# Patient Record
Sex: Female | Born: 1955 | Race: Black or African American | Hispanic: No | Marital: Single | Smoking: Never smoker
Health system: Southern US, Community
[De-identification: ages and names within clinical notes are randomized; demographics above are authoritative.]

## PROBLEM LIST (undated history)

## (undated) DIAGNOSIS — I10 Essential (primary) hypertension: Secondary | ICD-10-CM

## (undated) DIAGNOSIS — E079 Disorder of thyroid, unspecified: Secondary | ICD-10-CM

## (undated) DIAGNOSIS — E119 Type 2 diabetes mellitus without complications: Secondary | ICD-10-CM

## (undated) DIAGNOSIS — E7800 Pure hypercholesterolemia, unspecified: Secondary | ICD-10-CM

---

## 2013-04-28 ENCOUNTER — Encounter (HOSPITAL_BASED_OUTPATIENT_CLINIC_OR_DEPARTMENT_OTHER): Payer: Self-pay | Admitting: *Deleted

## 2013-04-28 ENCOUNTER — Emergency Department (HOSPITAL_BASED_OUTPATIENT_CLINIC_OR_DEPARTMENT_OTHER)
Admission: EM | Admit: 2013-04-28 | Discharge: 2013-04-28 | Disposition: A | Discharge status: Home / Self Care | Payer: Self-pay | Attending: Emergency Medicine | Admitting: Emergency Medicine

## 2013-04-28 DIAGNOSIS — J069 Acute upper respiratory infection, unspecified: Secondary | ICD-10-CM | POA: Insufficient documentation

## 2013-04-28 DIAGNOSIS — I10 Essential (primary) hypertension: Secondary | ICD-10-CM | POA: Insufficient documentation

## 2013-04-28 DIAGNOSIS — E079 Disorder of thyroid, unspecified: Secondary | ICD-10-CM | POA: Insufficient documentation

## 2013-04-28 DIAGNOSIS — J029 Acute pharyngitis, unspecified: Secondary | ICD-10-CM | POA: Insufficient documentation

## 2013-04-28 DIAGNOSIS — E7800 Pure hypercholesterolemia, unspecified: Secondary | ICD-10-CM | POA: Insufficient documentation

## 2013-04-28 DIAGNOSIS — Z79899 Other long term (current) drug therapy: Secondary | ICD-10-CM | POA: Insufficient documentation

## 2013-04-28 DIAGNOSIS — E119 Type 2 diabetes mellitus without complications: Secondary | ICD-10-CM | POA: Insufficient documentation

## 2013-04-28 HISTORY — DX: Disorder of thyroid, unspecified: E07.9

## 2013-04-28 HISTORY — DX: Pure hypercholesterolemia, unspecified: E78.00

## 2013-04-28 HISTORY — DX: Essential (primary) hypertension: I10

## 2013-04-28 HISTORY — DX: Type 2 diabetes mellitus without complications: E11.9

## 2013-04-28 NOTE — ED Notes (Signed)
Kelli Fiscal, rn enters room to discharge pt, pt and family not in room. Secretary states she saw pt leave.

## 2013-04-28 NOTE — ED Notes (Signed)
Pt amb to room 1 with quick steady gait, talking on cell phone in nad. Pt reports left ear pain and sore throat x Sunday, denies any fevers.

## 2013-04-28 NOTE — ED Provider Notes (Signed)
History    CSN: [id number] Arrival date & time 04/28/13  1227  First MD Initiated Contact with Patient 04/28/13 1244     Chief Complaint  Patient presents with  . Otalgia  . Sore Throat   (Consider location/radiation/quality/duration/timing/severity/associated sxs/prior Treatment) Patient is a 57 y.o. female presenting with ear pain and pharyngitis.  Otalgia Sore Throat   Pt reports several days of URI symptoms, dry cough, sore throat, L ear pain, not associated with fever. No vomiting. Not improved with OTC delsym and cough drops. Other family members have been sick as well.   Past Medical History  Diagnosis Date  . Hypertension   . Diabetes mellitus without complication   . Thyroid disease   . Hypercholesteremia    History reviewed. No pertinent past surgical history. History reviewed. No pertinent family history. History  Substance Use Topics  . Smoking status: Never Smoker   . Smokeless tobacco: Not on file  . Alcohol Use: Not on file   OB History   Grav Para Term Preterm Abortions TAB SAB Ect Mult Living                 Review of Systems  HENT: Positive for ear pain.    All other systems reviewed and are negative except as noted in HPI.   Allergies  Review of patient's allergies indicates no known allergies.  Home Medications   Current Outpatient Rx  Name  Route  Sig  Dispense  Refill  . atorvastatin (LIPITOR) 20 MG tablet   Oral   Take 20 mg by mouth daily.         . carvedilol (COREG) 12.5 MG tablet   Oral   Take 12.5 mg by mouth 2 (two) times daily with a meal.         . enalapril (VASOTEC) 10 MG tablet   Oral   Take 10 mg by mouth daily.         . furosemide (LASIX) 20 MG tablet   Oral   Take 20 mg by mouth daily.         Marland Kitchen levothyroxine (SYNTHROID, LEVOTHROID) 112 MCG tablet   Oral   Take 112 mcg by mouth daily before breakfast.         . metFORMIN (GLUCOPHAGE) 500 MG tablet   Oral   Take 500 mg by mouth 2 (two) times  daily with a meal.          BP 147/79  Pulse 66  Temp(Src) 98 F (36.7 C) (Oral)  Resp 18  Ht 5\' 4"  (1.626 m)  Wt 175 lb (79.379 kg)  BMI 30.02 kg/m2  SpO2 99% Physical Exam  Nursing note and vitals reviewed. Constitutional: She is oriented to person, place, and time. She appears well-developed and well-nourished.  HENT:  Head: Normocephalic and atraumatic.  Right Ear: Tympanic membrane normal.  Left Ear: Tympanic membrane normal.  Eyes: EOM are normal. Pupils are equal, round, and reactive to light.  Neck: Normal range of motion. Neck supple.  Cardiovascular: Normal rate, normal heart sounds and intact distal pulses.   Pulmonary/Chest: Effort normal and breath sounds normal.  Abdominal: Bowel sounds are normal. She exhibits no distension. There is no tenderness.  Musculoskeletal: Normal range of motion. She exhibits no edema and no tenderness.  Lymphadenopathy:    She has no cervical adenopathy.  Neurological: She is alert and oriented to person, place, and time. She has normal strength. No cranial nerve deficit or sensory deficit.  Skin: Skin is warm and dry. No rash noted.  Psychiatric: She has a normal mood and affect.    ED Course  Procedures (including critical care time) Labs Reviewed  RAPID STREP SCREEN  CULTURE, GROUP A STREP   No results found. 1. Viral URI     MDM  Normal exam, neg strep. Likely a mild viral URI. Advised symptomatic treatment, rest and plenty of fluids. Advised antibiotics would not be effective.   Charles B. Bernette Mayers, MD 04/28/13 1326

## 2013-04-30 LAB — CULTURE, GROUP A STREP

## 2014-10-28 ENCOUNTER — Emergency Department (HOSPITAL_BASED_OUTPATIENT_CLINIC_OR_DEPARTMENT_OTHER)
Admission: EM | Admit: 2014-10-28 | Discharge: 2014-10-28 | Disposition: A | Discharge status: Home / Self Care | Payer: 59 | Attending: Emergency Medicine | Admitting: Emergency Medicine

## 2014-10-28 ENCOUNTER — Encounter (HOSPITAL_BASED_OUTPATIENT_CLINIC_OR_DEPARTMENT_OTHER): Payer: Self-pay | Admitting: *Deleted

## 2014-10-28 ENCOUNTER — Emergency Department (HOSPITAL_BASED_OUTPATIENT_CLINIC_OR_DEPARTMENT_OTHER): Payer: 59

## 2014-10-28 DIAGNOSIS — I10 Essential (primary) hypertension: Secondary | ICD-10-CM | POA: Insufficient documentation

## 2014-10-28 DIAGNOSIS — M179 Osteoarthritis of knee, unspecified: Secondary | ICD-10-CM | POA: Insufficient documentation

## 2014-10-28 DIAGNOSIS — Z79899 Other long term (current) drug therapy: Secondary | ICD-10-CM | POA: Diagnosis not present

## 2014-10-28 DIAGNOSIS — R52 Pain, unspecified: Secondary | ICD-10-CM

## 2014-10-28 DIAGNOSIS — E780 Pure hypercholesterolemia: Secondary | ICD-10-CM | POA: Insufficient documentation

## 2014-10-28 DIAGNOSIS — E119 Type 2 diabetes mellitus without complications: Secondary | ICD-10-CM | POA: Insufficient documentation

## 2014-10-28 DIAGNOSIS — E079 Disorder of thyroid, unspecified: Secondary | ICD-10-CM | POA: Diagnosis not present

## 2014-10-28 DIAGNOSIS — M79604 Pain in right leg: Secondary | ICD-10-CM | POA: Diagnosis present

## 2014-10-28 DIAGNOSIS — M1711 Unilateral primary osteoarthritis, right knee: Secondary | ICD-10-CM

## 2014-10-28 MED ORDER — IBUPROFEN 800 MG PO TABS: 800.0000 mg | ORAL_TABLET | Freq: Three times a day (TID) | ORAL | Status: AC

## 2014-10-28 NOTE — Discharge Instructions (Signed)
Arthritis, Nonspecific °Arthritis is inflammation of a joint. This usually means pain, redness, warmth or swelling are present. One or more joints may be involved. There are a number of types of arthritis. Your caregiver may not be able to tell what type of arthritis you have right away. °CAUSES  °The most common cause of arthritis is the wear and tear on the joint (osteoarthritis). This causes damage to the cartilage, which can break down over time. The knees, hips, back and neck are most often affected by this type of arthritis. °Other types of arthritis and common causes of joint pain include: °· Sprains and other injuries near the joint. Sometimes minor sprains and injuries cause pain and swelling that develop hours later. °· Rheumatoid arthritis. This affects hands, feet and knees. It usually affects both sides of your body at the same time. It is often associated with chronic ailments, fever, weight loss and general weakness. °· Crystal arthritis. Gout and pseudo gout can cause occasional acute severe pain, redness and swelling in the foot, ankle, or knee. °· Infectious arthritis. Bacteria can get into a joint through a break in overlying skin. This can cause infection of the joint. Bacteria and viruses can also spread through the blood and affect your joints. °· Drug, infectious and allergy reactions. Sometimes joints can become mildly painful and slightly swollen with these types of illnesses. °SYMPTOMS  °· Pain is the main symptom. °· Your joint or joints can also be red, swollen and warm or hot to the touch. °· You may have a fever with certain types of arthritis, or even feel overall ill. °· The joint with arthritis will hurt with movement. Stiffness is present with some types of arthritis. °DIAGNOSIS  °Your caregiver will suspect arthritis based on your description of your symptoms and on your exam. Testing may be needed to find the type of arthritis: °· Blood and sometimes urine tests. °· X-ray tests  and sometimes CT or MRI scans. °· Removal of fluid from the joint (arthrocentesis) is done to check for bacteria, crystals or other causes. Your caregiver (or a specialist) will numb the area over the joint with a local anesthetic, and use a needle to remove joint fluid for examination. This procedure is only minimally uncomfortable. °· Even with these tests, your caregiver may not be able to tell what kind of arthritis you have. Consultation with a specialist (rheumatologist) may be helpful. °TREATMENT  °Your caregiver will discuss with you treatment specific to your type of arthritis. If the specific type cannot be determined, then the following general recommendations may apply. °Treatment of severe joint pain includes: °· Rest. °· Elevation. °· Anti-inflammatory medication (for example, ibuprofen) may be prescribed. Avoiding activities that cause increased pain. °· Only take over-the-counter or prescription medicines for pain and discomfort as recommended by your caregiver. °· Cold packs over an inflamed joint may be used for 10 to 15 minutes every hour. Hot packs sometimes feel better, but do not use overnight. Do not use hot packs if you are diabetic without your caregiver's permission. °· A cortisone shot into arthritic joints may help reduce pain and swelling. °· Any acute arthritis that gets worse over the next 1 to 2 days needs to be looked at to be sure there is no joint infection. °Long-term arthritis treatment involves modifying activities and lifestyle to reduce joint stress jarring. This can include weight loss. Also, exercise is needed to nourish the joint cartilage and remove waste. This helps keep the muscles   around the joint strong. °HOME CARE INSTRUCTIONS  °· Do not take aspirin to relieve pain if gout is suspected. This elevates uric acid levels. °· Only take over-the-counter or prescription medicines for pain, discomfort or fever as directed by your caregiver. °· Rest the joint as much as  possible. °· If your joint is swollen, keep it elevated. °· Use crutches if the painful joint is in your leg. °· Drinking plenty of fluids may help for certain types of arthritis. °· Follow your caregiver's dietary instructions. °· Try low-impact exercise such as: °¨ Swimming. °¨ Water aerobics. °¨ Biking. °¨ Walking. °· Morning stiffness is often relieved by a warm shower. °· Put your joints through regular range-of-motion. °SEEK MEDICAL CARE IF:  °· You do not feel better in 24 hours or are getting worse. °· You have side effects to medications, or are not getting better with treatment. °SEEK IMMEDIATE MEDICAL CARE IF:  °· You have a fever. °· You develop severe joint pain, swelling or redness. °· Many joints are involved and become painful and swollen. °· There is severe back pain and/or leg weakness. °· You have loss of bowel or bladder control. °Document Released: 11/21/2004 Document Revised: 01/06/2012 Document Reviewed: 12/07/2008 °[employer]® Patient Information ©2015 [employer]. This information is not intended to replace advice given to you by your health care provider. Make sure you discuss any questions you have with your health care provider. ° °

## 2014-10-28 NOTE — ED Notes (Addendum)
Pt reports (R) leg pain x several days.  Reports hx of knee pain in that leg.  Denies pain with  dorsiflexion. Denies long car rides, SOB.  Ambulatory.  nontender on palpation of leg.  Pt reports she pulled a muscle when she was climbing into a truck.

## 2014-10-28 NOTE — ED Provider Notes (Signed)
CSN: [id number]     Arrival date & time 10/28/14  1336 History   First MD Initiated Contact with Patient 10/28/14 1603     Chief Complaint  Patient presents with  . Leg Pain     (Consider location/radiation/quality/duration/timing/severity/associated sxs/prior Treatment) HPI Comments: Pt comes in with c/o right knee pain times 2 day. Was stepping into a truck and pt the pain started. Denies numbness. Pain is only with walking and certain position. No swelling. Hasn't taken anything for the symptoms. Denies swelling  The history is provided by the patient. No language interpreter was used.    Past Medical History  Diagnosis Date  . Hypertension   . Diabetes mellitus without complication   . Thyroid disease   . Hypercholesteremia    History reviewed. No pertinent past surgical history. History reviewed. No pertinent family history. History  Substance Use Topics  . Smoking status: Never Smoker   . Smokeless tobacco: Not on file  . Alcohol Use: Not on file   OB History    No data available     Review of Systems  All other systems reviewed and are negative.     Allergies  Review of patient's allergies indicates no known allergies.  Home Medications   Prior to Admission medications   Medication Sig Start Date End Date Taking? Authorizing Provider  atorvastatin (LIPITOR) 20 MG tablet Take 20 mg by mouth daily.    Historical Provider, MD  carvedilol (COREG) 12.5 MG tablet Take 12.5 mg by mouth 2 (two) times daily with a meal.    Historical Provider, MD  enalapril (VASOTEC) 10 MG tablet Take 10 mg by mouth daily.    Historical Provider, MD  furosemide (LASIX) 20 MG tablet Take 20 mg by mouth daily.    Historical Provider, MD  ibuprofen (ADVIL,MOTRIN) 800 MG tablet Take 1 tablet (800 mg total) by mouth 3 (three) times daily. 10/28/14   Teressa Lower, NP  levothyroxine (SYNTHROID, LEVOTHROID) 112 MCG tablet Take 112 mcg by mouth daily before breakfast.    Historical Provider,  MD  metFORMIN (GLUCOPHAGE) 500 MG tablet Take 500 mg by mouth 2 (two) times daily with a meal.    Historical Provider, MD   BP 145/72 mmHg  Pulse 72  Temp(Src) 98 F (36.7 C) (Oral)  Resp 16  SpO2 100% Physical Exam  Constitutional: She is oriented to person, place, and time. She appears well-developed and well-nourished.  Cardiovascular: Normal rate and regular rhythm.   Pulmonary/Chest: Effort normal.  Musculoskeletal:  Full rom of right knee. No laxity noted. No redness or warmth noted to the area  Neurological: She is alert and oriented to person, place, and time.  Skin: Skin is warm and dry.  Psychiatric: She has a normal mood and affect.  Nursing note and vitals reviewed.   ED Course  Procedures (including critical care time) Labs Review Labs Reviewed - No data to display  Imaging Review Dg Knee 1-2 Views Right  10/28/2014   CLINICAL DATA:  Initial encounter for lateral knee pain radiating down to shin without injury.  EXAM: RIGHT KNEE - 1-2 VIEW  COMPARISON:  None.  FINDINGS: Two views study shows no fracture. No subluxation or dislocation is evident. There is a small joint effusion. Hypertrophic spurring is visible in all 3 compartments.  IMPRESSION: Tricompartmental degenerative changes.   Electronically Signed   By: Kennith Center M.D.   On: 10/28/2014 16:34     EKG Interpretation None  MDM   Final diagnoses:  Pain  Osteoarthritis of right knee, unspecified osteoarthritis type    Discussed xray with pt. Pt to have follow up. Given ibuprofen for comfort. Given referral to DR. Vivi Barrack, NP 10/28/14 1652  Rolan Bucco, MD 10/28/14 2119

## 2014-10-28 NOTE — ED Notes (Signed)
Pt states right leg pain for past 2 days, starting as pt pulled herself up into a truck.  Pt c/o pain with palpation of posterior right knee.  No pain with palpation of dorsal or lateral right knee.

## 2016-07-19 IMAGING — CR DG KNEE 1-2V*R*
2 series · 2 of 2 positions shown · non-contrast
Comparison: None.

CLINICAL DATA: Initial encounter for lateral knee pain radiating
down to shin without injury.

EXAM:
RIGHT KNEE - 1-2 VIEW

[t knee ap right]
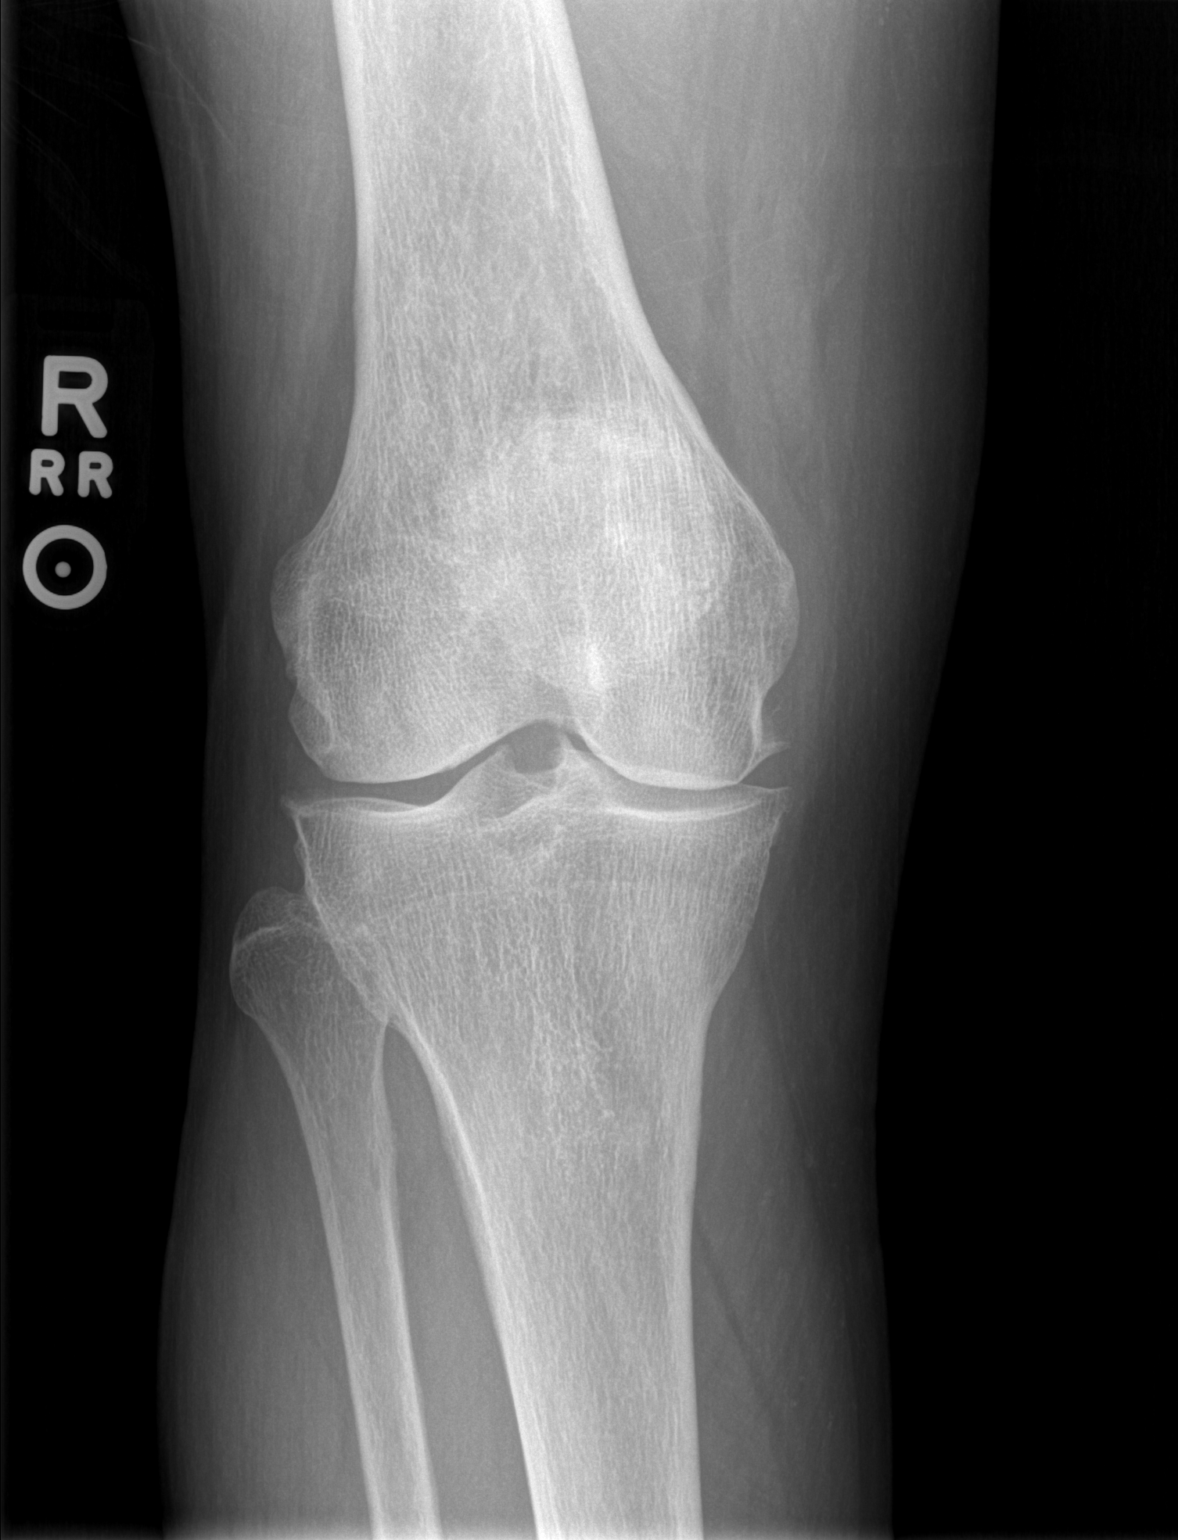

[t knee lat right]
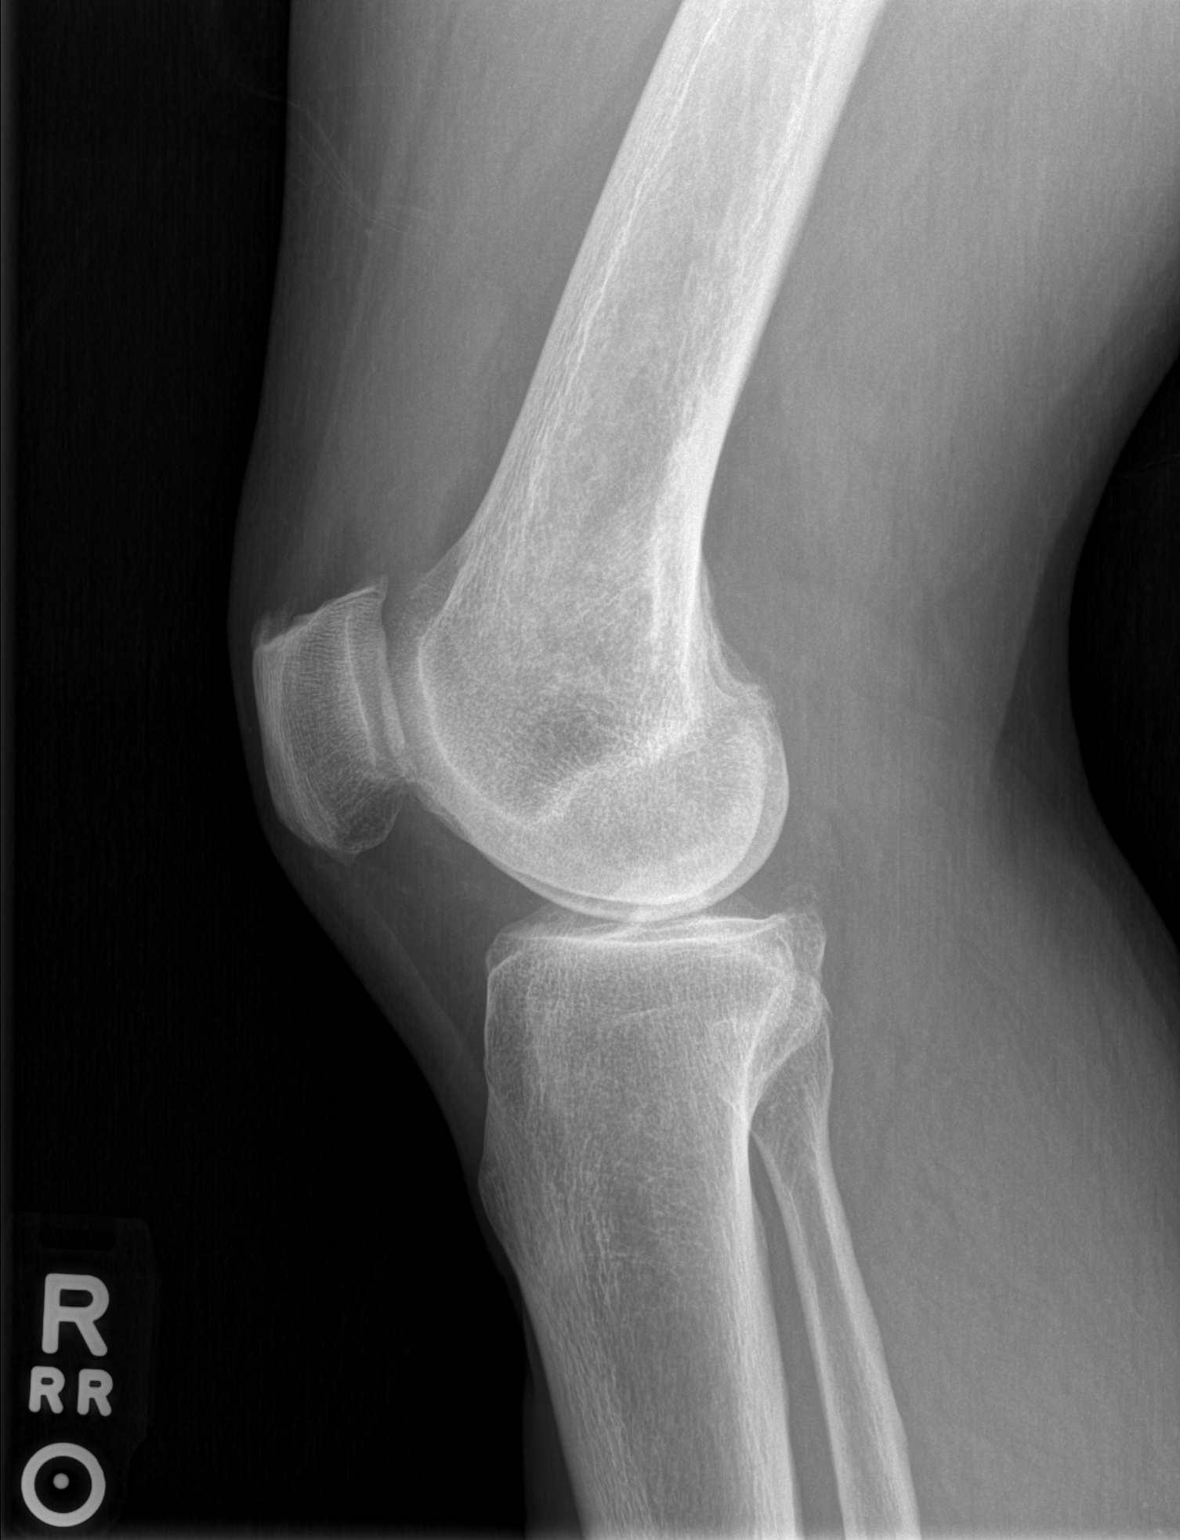

[2 of 2 positions shown; findings below may reference images not displayed]

FINDINGS: Two views study shows no fracture. No subluxation or dislocation is
evident. There is a small joint effusion. Hypertrophic spurring is
visible in all 3 compartments.
IMPRESSION: Tricompartmental degenerative changes.

## 2020-03-05 ENCOUNTER — Emergency Department (HOSPITAL_BASED_OUTPATIENT_CLINIC_OR_DEPARTMENT_OTHER)
Admission: EM | Admit: 2020-03-05 | Discharge: 2020-03-05 | Disposition: A | Discharge status: Home / Self Care | Payer: Self-pay | Attending: Emergency Medicine | Admitting: Emergency Medicine

## 2020-03-05 ENCOUNTER — Other Ambulatory Visit: Payer: Self-pay

## 2020-03-05 ENCOUNTER — Encounter (HOSPITAL_BASED_OUTPATIENT_CLINIC_OR_DEPARTMENT_OTHER): Payer: Self-pay | Admitting: Emergency Medicine

## 2020-03-05 DIAGNOSIS — Y92410 Unspecified street and highway as the place of occurrence of the external cause: Secondary | ICD-10-CM | POA: Insufficient documentation

## 2020-03-05 DIAGNOSIS — S161XXA Strain of muscle, fascia and tendon at neck level, initial encounter: Secondary | ICD-10-CM | POA: Insufficient documentation

## 2020-03-05 DIAGNOSIS — E079 Disorder of thyroid, unspecified: Secondary | ICD-10-CM | POA: Insufficient documentation

## 2020-03-05 DIAGNOSIS — I10 Essential (primary) hypertension: Secondary | ICD-10-CM | POA: Insufficient documentation

## 2020-03-05 DIAGNOSIS — Z79899 Other long term (current) drug therapy: Secondary | ICD-10-CM | POA: Insufficient documentation

## 2020-03-05 DIAGNOSIS — E119 Type 2 diabetes mellitus without complications: Secondary | ICD-10-CM | POA: Insufficient documentation

## 2020-03-05 DIAGNOSIS — Y9389 Activity, other specified: Secondary | ICD-10-CM | POA: Insufficient documentation

## 2020-03-05 DIAGNOSIS — S39012A Strain of muscle, fascia and tendon of lower back, initial encounter: Secondary | ICD-10-CM | POA: Insufficient documentation

## 2020-03-05 DIAGNOSIS — E782 Mixed hyperlipidemia: Secondary | ICD-10-CM | POA: Insufficient documentation

## 2020-03-05 DIAGNOSIS — Y999 Unspecified external cause status: Secondary | ICD-10-CM | POA: Insufficient documentation

## 2020-03-05 MED ORDER — METHOCARBAMOL 500 MG PO TABS: 500.0000 mg | ORAL_TABLET | Freq: Four times a day (QID) | ORAL | 0 refills | Status: AC | PRN

## 2020-03-05 MED ORDER — NAPROXEN 375 MG PO TABS: 375.0000 mg | ORAL_TABLET | Freq: Two times a day (BID) | ORAL | 0 refills | Status: AC

## 2020-03-05 MED ORDER — MEDROXYPROGESTERONE ACETATE 10 MG PO TABS
10.0000 mg | ORAL_TABLET | Freq: Every day | ORAL | Status: DC
  Filled 2020-03-05: qty 1

## 2020-03-05 NOTE — Discharge Instructions (Signed)
1.  Schedule follow-up with your family doctor within 3 to 7 days. 2.  Return to the emergency department if you get shortness of breath, numbness or tingling in your extremities or other concerning symptoms. 3.  You will probably be more sore and stiff between 2 and 7 days.  You may take a short course of naproxen as prescribed.  You have been given a prescription for Robaxin as a muscle relaxer.
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
